# Patient Record
Sex: Male | Born: 1995 | Race: White | Hispanic: No | Marital: Single | Smoking: Never smoker
Health system: Southern US, Community
[De-identification: ages and names within clinical notes are randomized; demographics above are authoritative.]

---

## 2018-08-12 ENCOUNTER — Other Ambulatory Visit: Payer: Self-pay

## 2018-08-12 ENCOUNTER — Emergency Department (INDEPENDENT_AMBULATORY_CARE_PROVIDER_SITE_OTHER)
Admission: EM | Admit: 2018-08-12 | Discharge: 2018-08-12 | Disposition: A | Discharge status: Home / Self Care | Payer: BLUE CROSS/BLUE SHIELD | Source: Home / Self Care | Attending: Emergency Medicine | Admitting: Emergency Medicine

## 2018-08-12 DIAGNOSIS — J029 Acute pharyngitis, unspecified: Secondary | ICD-10-CM | POA: Diagnosis not present

## 2018-08-12 DIAGNOSIS — J111 Influenza due to unidentified influenza virus with other respiratory manifestations: Secondary | ICD-10-CM

## 2018-08-12 LAB — POCT INFLUENZA A/B
Influenza A, POC: NEGATIVE
Influenza B, POC: NEGATIVE

## 2018-08-12 LAB — POCT RAPID STREP A (OFFICE): Rapid Strep A Screen: NEGATIVE

## 2018-08-12 MED ORDER — OSELTAMIVIR PHOSPHATE 75 MG PO CAPS: 75.0000 mg | ORAL_CAPSULE | Freq: Two times a day (BID) | ORAL | 0 refills | Status: AC

## 2018-08-12 NOTE — ED Provider Notes (Signed)
[facility]    CSN: [id number] Arrival date & time: 08/12/18  1407     History   Chief Complaint Chief Complaint  Patient presents with  . Sore Throat  . Emesis    HPI Marco Sellers is a 23 y.o. male.   HPI Patient enters with onset yesterday morning of sore throat associated with myalgias and a runny nose and headache.  He has had multiple friends recently with strep throat.  He does have a history of strep throat.  He did not have a flu shot this year.  He does have myalgias with his above symptoms.  He vomited this morning.  He took Mucinex cold and flu earlier today. History reviewed. No pertinent past medical history.  There are no active problems to display for this patient.   History reviewed. No pertinent surgical history.     Home Medications    Prior to Admission medications   Medication Sig Start Date End Date Taking? Authorizing Provider  oseltamivir (TAMIFLU) 75 MG capsule Take 1 capsule (75 mg total) by mouth every 12 (twelve) hours. 08/12/18   Collene Gobble, MD    Family History History reviewed. No pertinent family history.  Social History Social History   Tobacco Use  . Smoking status: Never Smoker  . Smokeless tobacco: Never Used  Substance Use Topics  . Alcohol use: Not on file  . Drug use: Not on file     Allergies   Patient has no known allergies.   Review of Systems Review of Systems  Constitutional: Positive for chills and fever.  HENT: Positive for sore throat.   Eyes: Negative.   Respiratory: Positive for cough.   Cardiovascular: Negative.   Gastrointestinal: Positive for nausea and vomiting.  Endocrine: Negative.   Genitourinary: Negative.      Physical Exam Triage Vital Signs ED Triage Vitals  Enc Vitals Group     BP 08/12/18 1436 138/83     Pulse Rate 08/12/18 1436 (!) 136     Resp --      Temp 08/12/18 1436 98.9 F (37.2 C)     Temp Source 08/12/18 1436 Oral     SpO2 08/12/18 1436 97 %   Weight 08/12/18 1437 160 lb (72.6 kg)     Height 08/12/18 1437 5\' 10"  (1.778 m)     Head Circumference --      Peak Flow --      Pain Score 08/12/18 1437 0     Pain Loc --      Pain Edu? --      Excl. in GC? --    No data found.  Updated Vital Signs BP 138/83 (BP Location: Right Arm)   Pulse (!) 112   Temp 98.9 F (37.2 C) (Oral)   Resp 16   Ht 5\' 10"  (1.778 m)   Wt 72.6 kg   SpO2 97%   BMI 22.96 kg/m   Visual Acuity Right Eye Distance:   Left Eye Distance:   Bilateral Distance:    Right Eye Near:   Left Eye Near:    Bilateral Near:     Physical Exam Constitutional:      Appearance: He is well-developed.  HENT:     Head: Normocephalic and atraumatic.     Right Ear: Tympanic membrane normal.     Left Ear: Tympanic membrane normal.     Mouth/Throat:     Mouth: Mucous membranes are moist.     Pharynx: Pharyngeal swelling and  posterior oropharyngeal erythema present.     Comments: There may be a minimal amount of exudate on the tonsils. Eyes:     Conjunctiva/sclera: Conjunctivae normal.  Neck:     Musculoskeletal: Normal range of motion.  Cardiovascular:     Rate and Rhythm: Tachycardia present.     Heart sounds: Normal heart sounds.  Pulmonary:     Effort: Pulmonary effort is normal.     Breath sounds: Normal breath sounds.  Skin:    General: Skin is warm and dry.  Neurological:     Mental Status: He is alert.      UC Treatments / Results  Labs (all labs ordered are listed, but only abnormal results are displayed) Labs Reviewed  POCT RAPID STREP A (OFFICE)  POCT INFLUENZA A/B    EKG None  Radiology No results found.  Procedures Procedures (including critical care time)  Medications Ordered in UC Medications - No data to display  Initial Impression / Assessment and Plan / UC Course  I have reviewed the triage vital signs and the nursing notes.  Pertinent labs & imaging results that were available during my care of the patient were  reviewed by me and considered in my medical decision making (see chart for details). Not clear if this is strep throat or flu.  He does have a history of recurrent strep throat.  There is a hint of exudate over the tonsils.  We will go ahead and check for both flu and strep.  Strep screen negative flu test negative will treat as influenza.  He was given a note for work.  Throat culture was done.  A second strep screen was also done.  Patient declined medications for nausea.  Patient states it was a medication that gave him diarrhea but he has never been allergic to any medications.     Final Clinical Impressions(s) / UC Diagnoses   Final diagnoses:  Flu syndrome  Sore throat     Discharge Instructions     Take medication as instructed. If you develop chest pain or shortness of breath please go to the emergency room. We will call you with culture results.    ED Prescriptions    Medication Sig Dispense Auth. Provider   oseltamivir (TAMIFLU) 75 MG capsule Take 1 capsule (75 mg total) by mouth every 12 (twelve) hours. 10 capsule Collene Gobble, MD     Controlled Substance Prescriptions [place] Controlled Substance Registry consulted? Not Applicable   Collene Gobble, MD 08/12/18 1700

## 2018-08-12 NOTE — Discharge Instructions (Signed)
Take medication as instructed. If you develop chest pain or shortness of breath please go to the emergency room. We will call you with culture results.

## 2018-08-12 NOTE — ED Triage Notes (Signed)
Pt c/o sore throat, bodyaches, runny nose and headache since yesterday. Vomited when he entered UC. Taking mucinex cold and flu prn. Hx of frequent strep as child.

## 2018-08-13 ENCOUNTER — Telehealth: Payer: Self-pay

## 2018-08-13 LAB — STREP A DNA PROBE: GROUP A STREP PROBE: NOT DETECTED

## 2018-08-13 NOTE — Telephone Encounter (Signed)
Pt stated he is feeling a little better.  Given negative lab results.

## 2018-12-20 ENCOUNTER — Other Ambulatory Visit: Payer: Self-pay | Admitting: Family Medicine

## 2018-12-20 ENCOUNTER — Other Ambulatory Visit: Payer: Self-pay

## 2018-12-20 ENCOUNTER — Ambulatory Visit: Payer: Self-pay

## 2018-12-20 DIAGNOSIS — M25511 Pain in right shoulder: Secondary | ICD-10-CM

## 2020-11-18 IMAGING — DX RIGHT SHOULDER - 2+ VIEW
4 series · 4 of 4 positions shown · non-contrast
Comparison: None.

CLINICAL DATA: Right shoulder pain since a twisting injury lifting
a [REDACTED]. Initial encounter.

EXAM:
RIGHT SHOULDER - 2+ VIEW

[shoulder ap (1 of 2)]
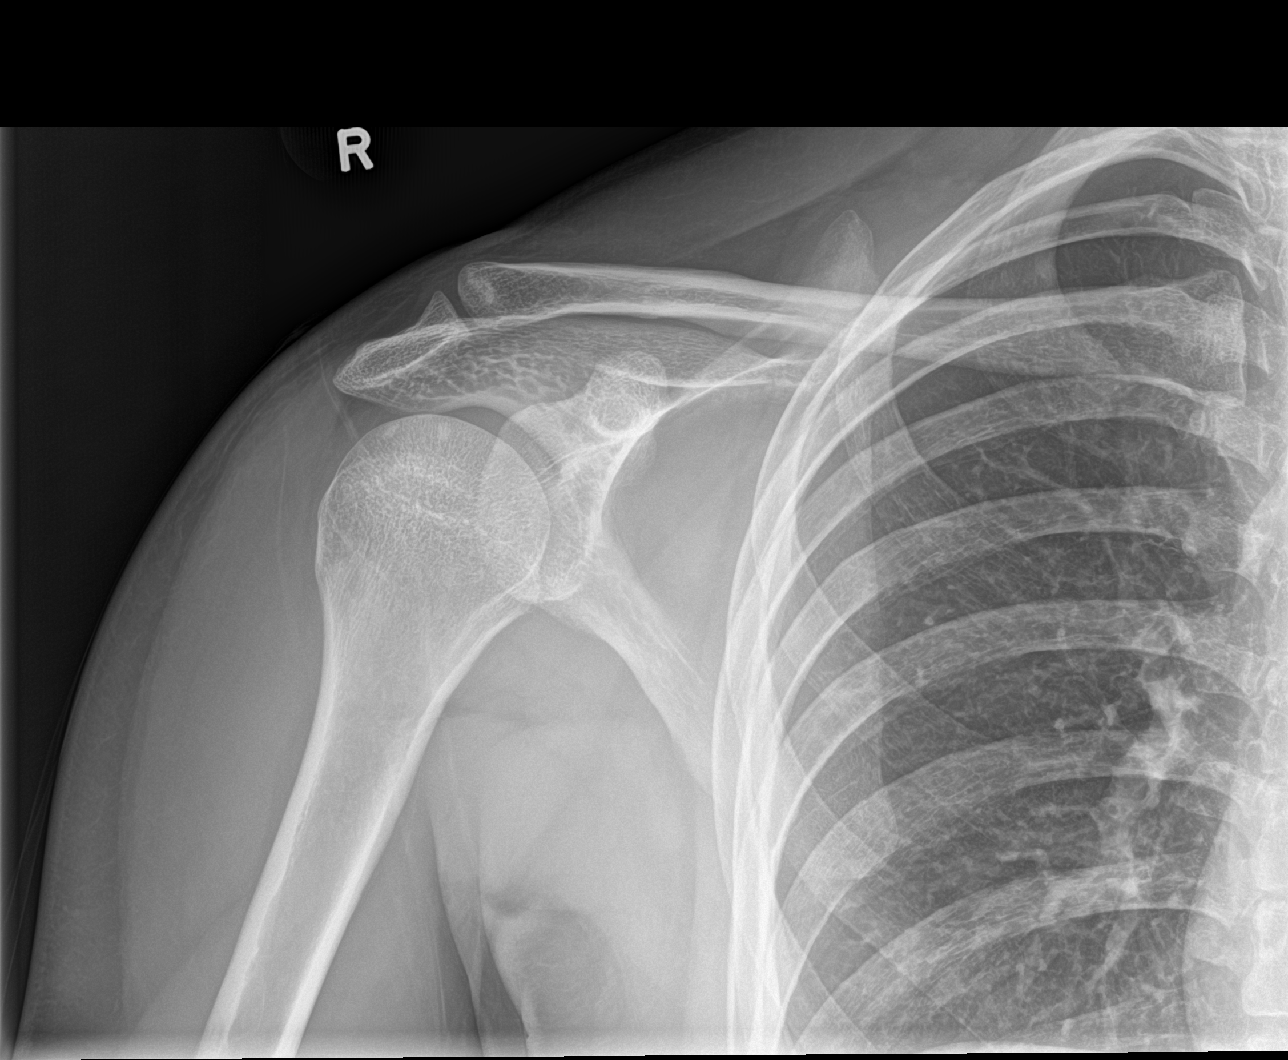

[shoulder ap (2 of 2)]
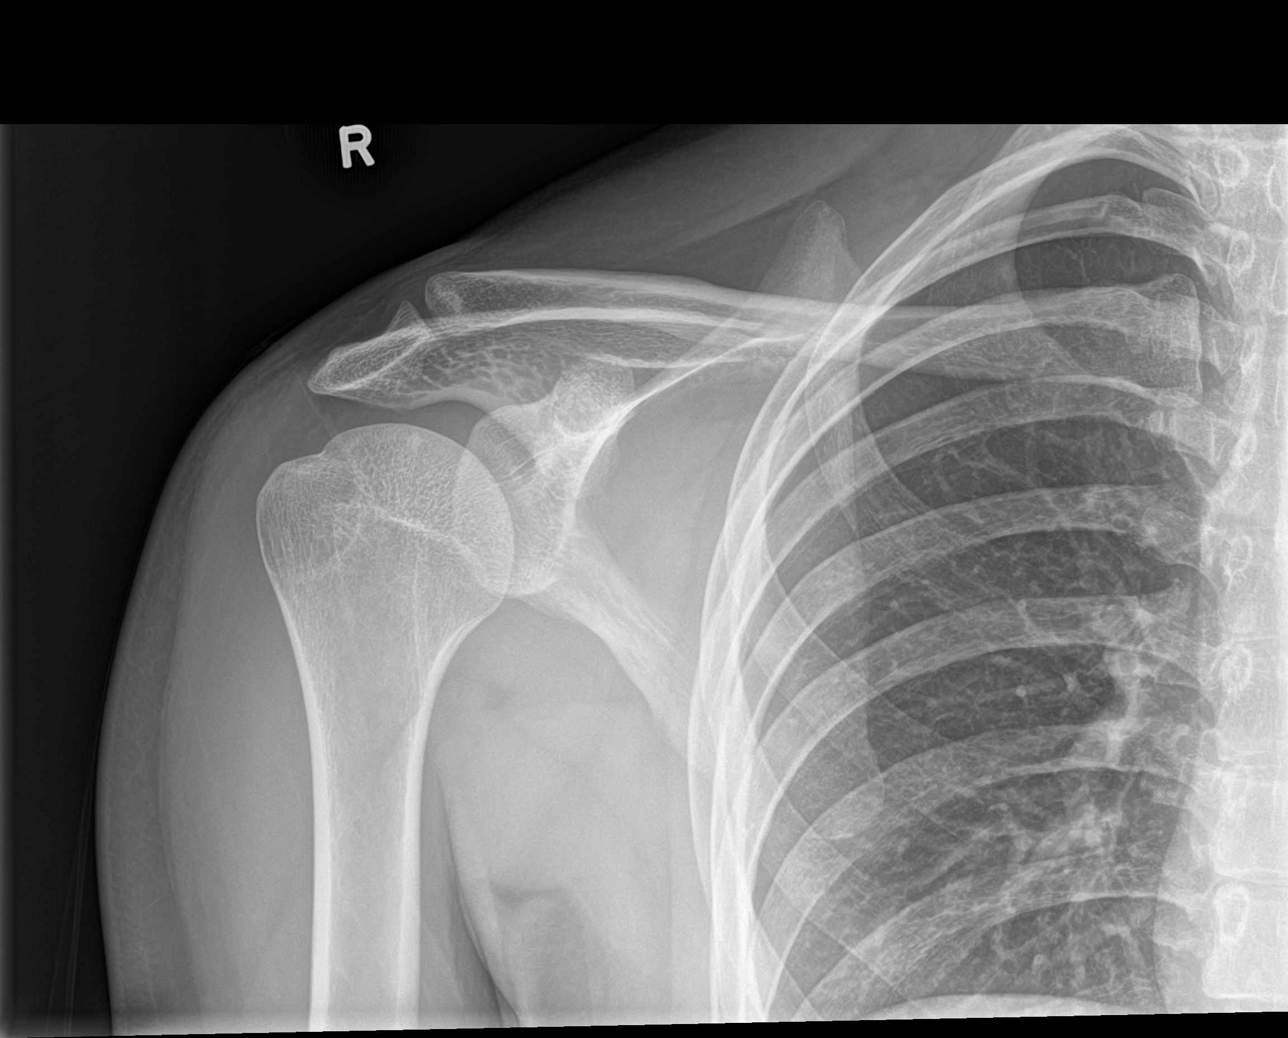

[shoulder y-view]
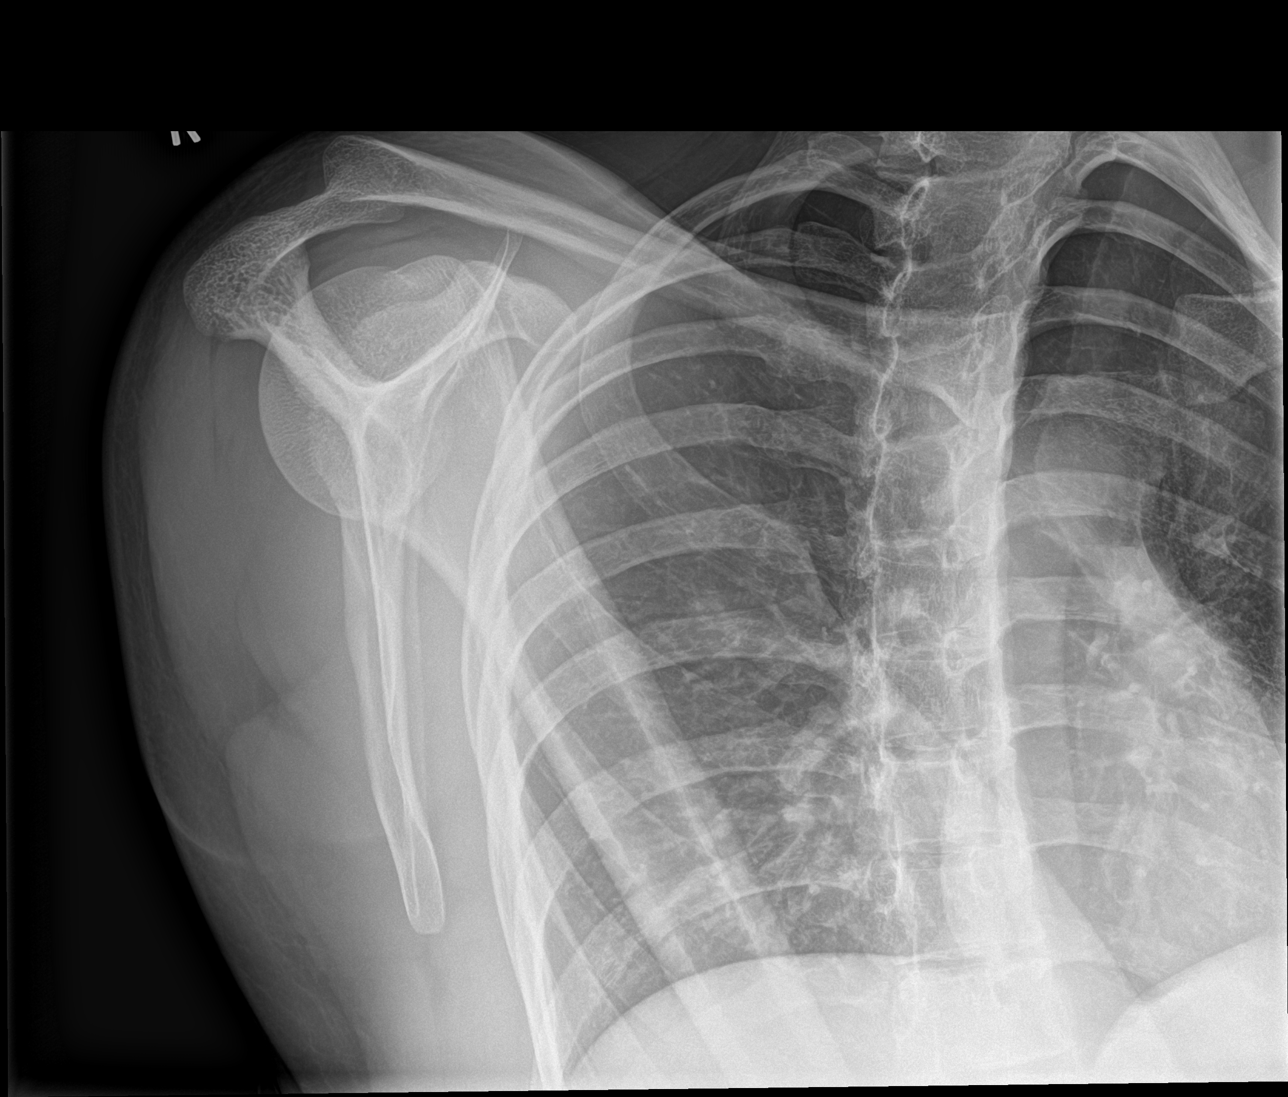

[shoulder axial]
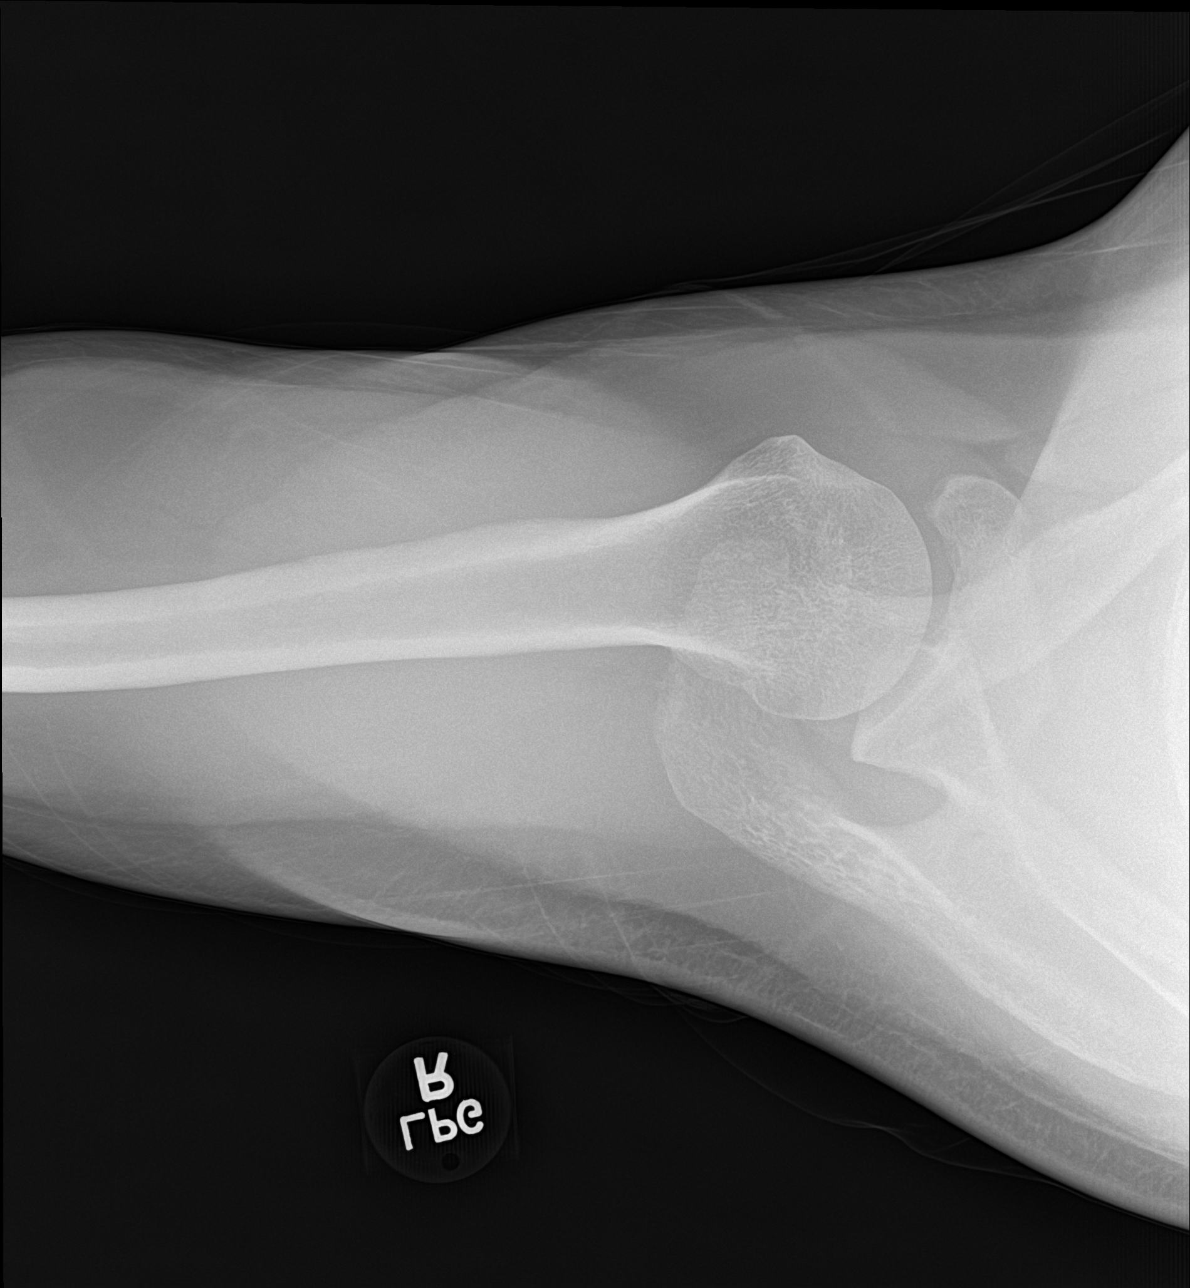

[4 of 4 positions shown; findings below may reference images not displayed]

FINDINGS: There is no evidence of fracture or dislocation. There is no
evidence of arthropathy or other focal bone abnormality. Soft
tissues are unremarkable.
IMPRESSION: Negative exam.
